# Patient Record
Sex: Male | Born: 2000 | Race: White | Hispanic: No | Marital: Single | Smoking: Never smoker
Health system: Southern US, Community
[De-identification: ages and names within clinical notes are randomized; demographics above are authoritative.]

## PROBLEM LIST (undated history)

## (undated) DIAGNOSIS — J4520 Mild intermittent asthma, uncomplicated: Secondary | ICD-10-CM

## (undated) HISTORY — PX: NO PAST SURGERIES: SHX2092

---
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
## 2023-01-20 ENCOUNTER — Encounter
Admission: RE | Admit: 2023-01-20 | Discharge: 2023-01-20 | Disposition: A | Discharge status: Home / Self Care | Payer: BC Managed Care – PPO | Source: Ambulatory Visit | Attending: Orthopedic Surgery | Admitting: Orthopedic Surgery

## 2023-01-20 ENCOUNTER — Other Ambulatory Visit: Payer: Self-pay | Admitting: Orthopedic Surgery

## 2023-01-20 HISTORY — DX: Mild intermittent asthma, uncomplicated: J45.20

## 2023-01-20 NOTE — Patient Instructions (Addendum)
Your procedure is scheduled on: Tuesday, April 9 Report to the Registration Desk on the 1st floor of the [facility]. To find out your arrival time, please call [phone] between 1PM - 3PM on: Monday, April 8 If your arrival time is 6:00 am, do not arrive before that time as the [place] entrance doors do not open until 6:00 am.  REMEMBER: Instructions that are not followed completely may result in serious medical risk, up to and including death; or upon the discretion of your surgeon and anesthesiologist your surgery may need to be rescheduled.  Do not eat food after midnight the night before surgery.  No gum chewing or hard candies.  You may however, drink CLEAR liquids up to 2 hours before you are scheduled to arrive for your surgery. Do not drink anything within 2 hours of your scheduled arrival time.  Clear liquids include: - water  - apple juice without pulp - gatorade (not RED colors) - black coffee or tea (Do NOT add milk or creamers to the coffee or tea) Do NOT drink anything that is not on this list.  One week prior to surgery: starting April 2 Stop meloxicam and Anti-inflammatories (NSAIDS) such as Advil, Aleve, Ibuprofen, Motrin, Naproxen, Naprosyn and Aspirin based products such as Excedrin, Goody's Powder, BC Powder. Stop ANY OVER THE COUNTER supplements until after surgery. You may however, continue to take Tylenol if needed for pain up until the day of surgery.  DO NOT TAKE ANY MEDICATIONS THE MORNING OF SURGERY   No Alcohol for 24 hours before or after surgery.  No Smoking including e-cigarettes for 24 hours before surgery.  No chewable tobacco products for at least 6 hours before surgery.  No nicotine patches on the day of surgery.  Do not use any "recreational" drugs for at least a week (preferably 2 weeks) before your surgery.  Please be advised that the combination of cocaine and anesthesia may have negative outcomes, up to and including death. If you  test positive for cocaine, your surgery will be cancelled.  On the morning of surgery brush your teeth with toothpaste and water, you may rinse your mouth with mouthwash if you wish. Do not swallow any toothpaste or mouthwash.  Use CHG Soap as directed on instruction sheet.  Do not wear jewelry, make-up, hairpins, clips or nail polish.  Do not wear lotions, powders, or perfumes.   Do not shave body hair from the neck down 48 hours before surgery.  Contact lenses, hearing aids and dentures may not be worn into surgery.  Do not bring valuables to the hospital. [facility] is not responsible for any missing/lost belongings or valuables.   Notify your doctor if there is any change in your medical condition (cold, fever, infection).  Wear comfortable clothing (specific to your surgery type) to the hospital.  After surgery, you can help prevent lung complications by doing breathing exercises.  Take deep breaths and cough every 1-2 hours. Your doctor may order a device called an Incentive Spirometer to help you take deep breaths.  If you are being discharged the day of surgery, you will not be allowed to drive home. You will need a responsible individual to drive you home and stay with you for 24 hours after surgery.   If you are taking public transportation, you will need to have a responsible individual with you.  Please call the [place] Dept[facility] at [phone] if you have any questions about these instructions.  Surgery  Visitation Policy:  Patients having surgery or a procedure may have two visitors.  Children under the age of 66 must have an adult with them who is not the patient.     Preparing for Surgery with CHLORHEXIDINE GLUCONATE (CHG) Soap  Chlorhexidine Gluconate (CHG) Soap  o An antiseptic cleaner that kills germs and bonds with the skin to continue killing germs even after washing  o Used for showering the night before surgery and morning of  surgery  Before surgery, you can play an important role by reducing the number of germs on your skin.  CHG (Chlorhexidine gluconate) soap is an antiseptic cleanser which kills germs and bonds with the skin to continue killing germs even after washing.  Please do not use if you have an allergy to CHG or antibacterial soaps. If your skin becomes reddened/irritated stop using the CHG.  1. Shower the NIGHT BEFORE SURGERY and the MORNING OF SURGERY with CHG soap.  2. If you choose to wash your hair, wash your hair first as usual with your normal shampoo.  3. After shampooing, rinse your hair and body thoroughly to remove the shampoo.  4. Use CHG as you would any other liquid soap. You can apply CHG directly to the skin and wash gently with a scrungie or a clean washcloth.  5. Apply the CHG soap to your body only from the neck down. Do not use on open wounds or open sores. Avoid contact with your eyes, ears, mouth, and genitals (private parts). Wash face and genitals (private parts) with your normal soap.  6. Wash thoroughly, paying special attention to the area where your surgery will be performed.  7. Thoroughly rinse your body with warm water.  8. Do not shower/wash with your normal soap after using and rinsing off the CHG soap.  9. Pat yourself dry with a clean towel.  10. Wear clean pajamas to bed the night before surgery.  12. Place clean sheets on your bed the night of your first shower and do not sleep with pets.  13. Shower again with the CHG soap on the day of surgery prior to arriving at the hospital.  14. Do not apply any deodorants/lotions/powders.  15. Please wear clean clothes to the hospital.

## 2023-01-30 MED ORDER — ACETAMINOPHEN 500 MG PO TABS
1000.0000 mg | ORAL_TABLET | ORAL | Status: AC
  Administered 2023-01-31: 1000 mg via ORAL

## 2023-01-30 MED ORDER — CHLORHEXIDINE GLUCONATE CLOTH 2 % EX PADS: 6.0000 | MEDICATED_PAD | Freq: Once | CUTANEOUS | Status: DC

## 2023-01-30 MED ORDER — CHLORHEXIDINE GLUCONATE CLOTH 2 % EX PADS
6.0000 | MEDICATED_PAD | Freq: Once | CUTANEOUS | Status: AC
  Administered 2023-01-31: 6 via TOPICAL

## 2023-01-30 MED ORDER — CHLORHEXIDINE GLUCONATE 0.12 % MT SOLN
15.0000 mL | Freq: Once | OROMUCOSAL | Status: AC
  Administered 2023-01-31: 15 mL via OROMUCOSAL

## 2023-01-30 MED ORDER — FAMOTIDINE 20 MG PO TABS
20.0000 mg | ORAL_TABLET | Freq: Once | ORAL | Status: AC
  Administered 2023-01-31: 20 mg via ORAL

## 2023-01-30 MED ORDER — ORAL CARE MOUTH RINSE: 15.0000 mL | Freq: Once | OROMUCOSAL | Status: AC

## 2023-01-30 MED ORDER — CEFAZOLIN SODIUM-DEXTROSE 2-4 GM/100ML-% IV SOLN
2.0000 g | INTRAVENOUS | Status: AC
  Administered 2023-01-31: 2 g via INTRAVENOUS

## 2023-01-30 MED ORDER — LACTATED RINGERS IV SOLN: INTRAVENOUS | Status: DC

## 2023-01-31 ENCOUNTER — Other Ambulatory Visit: Payer: Self-pay

## 2023-01-31 ENCOUNTER — Encounter: Payer: Self-pay | Admitting: Orthopedic Surgery

## 2023-01-31 ENCOUNTER — Ambulatory Visit: Payer: BC Managed Care – PPO

## 2023-01-31 ENCOUNTER — Ambulatory Visit
Admission: RE | Admit: 2023-01-31 | Discharge: 2023-01-31 | Disposition: A | Discharge status: Home / Self Care | Payer: BC Managed Care – PPO | Attending: Orthopedic Surgery | Admitting: Orthopedic Surgery

## 2023-01-31 ENCOUNTER — Encounter
Admission: RE | Disposition: A | Discharge status: Home / Self Care | Payer: Self-pay | Source: Home / Self Care | Attending: Orthopedic Surgery

## 2023-01-31 ENCOUNTER — Ambulatory Visit: Payer: BC Managed Care – PPO | Admitting: Anesthesiology

## 2023-01-31 ENCOUNTER — Ambulatory Visit: Payer: BC Managed Care – PPO | Admitting: Urgent Care

## 2023-01-31 DIAGNOSIS — X58XXXA Exposure to other specified factors, initial encounter: Secondary | ICD-10-CM | POA: Diagnosis not present

## 2023-01-31 DIAGNOSIS — S83512A Sprain of anterior cruciate ligament of left knee, initial encounter: Secondary | ICD-10-CM | POA: Insufficient documentation

## 2023-01-31 DIAGNOSIS — J4520 Mild intermittent asthma, uncomplicated: Secondary | ICD-10-CM | POA: Diagnosis not present

## 2023-01-31 HISTORY — PX: KNEE ARTHROSCOPY WITH ANTERIOR CRUCIATE LIGAMENT (ACL) REPAIR WITH HAMSTRING GRAFT: SHX5645

## 2023-01-31 LAB — GLUCOSE, CAPILLARY: Glucose-Capillary: 158 mg/dL — ABNORMAL HIGH (ref 70–99)

## 2023-01-31 SURGERY — KNEE ARTHROSCOPY WITH ANTERIOR CRUCIATE LIGAMENT (ACL) REPAIR WITH HAMSTRING GRAFT
Anesthesia: General | Laterality: Left

## 2023-01-31 MED ORDER — HYDROMORPHONE HCL 1 MG/ML IJ SOLN
INTRAMUSCULAR | Status: AC
  Filled 2023-01-31: qty 1

## 2023-01-31 MED ORDER — NEOMYCIN-POLYMYXIN B GU 40-200000 IR SOLN
Status: AC
  Filled 2023-01-31: qty 20

## 2023-01-31 MED ORDER — EPHEDRINE 5 MG/ML INJ
INTRAVENOUS | Status: AC
  Filled 2023-01-31: qty 5

## 2023-01-31 MED ORDER — ROCURONIUM BROMIDE 100 MG/10ML IV SOLN
INTRAVENOUS | Status: DC | PRN
  Administered 2023-01-31: 40 mg via INTRAVENOUS

## 2023-01-31 MED ORDER — ONDANSETRON HCL 4 MG PO TABS: 4.0000 mg | ORAL_TABLET | Freq: Three times a day (TID) | ORAL | 0 refills | Status: AC | PRN

## 2023-01-31 MED ORDER — BUPIVACAINE HCL (PF) 0.5 % IJ SOLN
INTRAMUSCULAR | Status: DC | PRN
  Administered 2023-01-31: 20 mL
  Administered 2023-01-31: 10 mL

## 2023-01-31 MED ORDER — FAMOTIDINE 20 MG PO TABS
ORAL_TABLET | ORAL | Status: AC
  Filled 2023-01-31: qty 1

## 2023-01-31 MED ORDER — CHLORHEXIDINE GLUCONATE 0.12 % MT SOLN
OROMUCOSAL | Status: AC
  Filled 2023-01-31: qty 15

## 2023-01-31 MED ORDER — SUGAMMADEX SODIUM 200 MG/2ML IV SOLN
INTRAVENOUS | Status: DC | PRN
  Administered 2023-01-31: 160 mg via INTRAVENOUS

## 2023-01-31 MED ORDER — OXYCODONE HCL 5 MG PO TABS: 5.0000 mg | ORAL_TABLET | ORAL | 0 refills | Status: AC | PRN

## 2023-01-31 MED ORDER — MIDAZOLAM HCL 2 MG/2ML IJ SOLN
INTRAMUSCULAR | Status: AC
  Filled 2023-01-31: qty 2

## 2023-01-31 MED ORDER — FENTANYL CITRATE (PF) 100 MCG/2ML IJ SOLN
INTRAMUSCULAR | Status: AC
  Filled 2023-01-31: qty 2

## 2023-01-31 MED ORDER — ROCURONIUM BROMIDE 10 MG/ML (PF) SYRINGE
PREFILLED_SYRINGE | INTRAVENOUS | Status: AC
  Filled 2023-01-31: qty 10

## 2023-01-31 MED ORDER — OXYCODONE HCL 5 MG PO TABS: 5.0000 mg | ORAL_TABLET | Freq: Once | ORAL | Status: DC | PRN

## 2023-01-31 MED ORDER — PROPOFOL 10 MG/ML IV BOLUS
INTRAVENOUS | Status: DC | PRN
  Administered 2023-01-31: 200 mg via INTRAVENOUS

## 2023-01-31 MED ORDER — LIDOCAINE HCL 1 % IJ SOLN
INTRAMUSCULAR | Status: DC | PRN
  Administered 2023-01-31: 5 mL

## 2023-01-31 MED ORDER — DROPERIDOL 2.5 MG/ML IJ SOLN
0.6250 mg | Freq: Once | INTRAMUSCULAR | Status: AC
  Administered 2023-01-31: 0.625 mg via INTRAVENOUS

## 2023-01-31 MED ORDER — FENTANYL CITRATE (PF) 100 MCG/2ML IJ SOLN
INTRAMUSCULAR | Status: DC | PRN
  Administered 2023-01-31: 100 ug via INTRAVENOUS

## 2023-01-31 MED ORDER — MIDAZOLAM HCL 2 MG/2ML IJ SOLN
INTRAMUSCULAR | Status: DC | PRN
  Administered 2023-01-31: 2 mg via INTRAVENOUS

## 2023-01-31 MED ORDER — 0.9 % SODIUM CHLORIDE (POUR BTL) OPTIME
TOPICAL | Status: DC | PRN
  Administered 2023-01-31: 250 mL

## 2023-01-31 MED ORDER — ONDANSETRON HCL 4 MG/2ML IJ SOLN
INTRAMUSCULAR | Status: DC | PRN
  Administered 2023-01-31: 4 mg via INTRAVENOUS

## 2023-01-31 MED ORDER — FENTANYL CITRATE (PF) 100 MCG/2ML IJ SOLN
25.0000 ug | INTRAMUSCULAR | Status: DC | PRN
  Administered 2023-01-31 (×4): 25 ug via INTRAVENOUS

## 2023-01-31 MED ORDER — DEXAMETHASONE SODIUM PHOSPHATE 10 MG/ML IJ SOLN
INTRAMUSCULAR | Status: DC | PRN
  Administered 2023-01-31: 10 mg via INTRAVENOUS

## 2023-01-31 MED ORDER — BUPIVACAINE HCL (PF) 0.5 % IJ SOLN
INTRAMUSCULAR | Status: AC
  Filled 2023-01-31: qty 10

## 2023-01-31 MED ORDER — OXYCODONE HCL 5 MG/5ML PO SOLN: 5.0000 mg | Freq: Once | ORAL | Status: DC | PRN

## 2023-01-31 MED ORDER — ONDANSETRON HCL 4 MG/2ML IJ SOLN
INTRAMUSCULAR | Status: AC
  Filled 2023-01-31: qty 2

## 2023-01-31 MED ORDER — BUPIVACAINE HCL (PF) 0.25 % IJ SOLN
INTRAMUSCULAR | Status: DC | PRN
  Administered 2023-01-31: 30 mL

## 2023-01-31 MED ORDER — DROPERIDOL 2.5 MG/ML IJ SOLN
INTRAMUSCULAR | Status: AC
  Filled 2023-01-31: qty 2

## 2023-01-31 MED ORDER — EPHEDRINE SULFATE (PRESSORS) 50 MG/ML IJ SOLN
INTRAMUSCULAR | Status: DC | PRN
  Administered 2023-01-31 (×2): 10 mg via INTRAVENOUS

## 2023-01-31 MED ORDER — BUPIVACAINE HCL (PF) 0.5 % IJ SOLN
INTRAMUSCULAR | Status: AC
  Filled 2023-01-31: qty 20

## 2023-01-31 MED ORDER — LACTATED RINGERS IR SOLN
Status: DC | PRN
  Administered 2023-01-31: 3000 mL
  Administered 2023-01-31: 12000 mL
  Administered 2023-01-31: 3000 mL

## 2023-01-31 MED ORDER — ACETAMINOPHEN 500 MG PO TABS
ORAL_TABLET | ORAL | Status: AC
  Filled 2023-01-31: qty 2

## 2023-01-31 MED ORDER — CEFAZOLIN SODIUM-DEXTROSE 2-4 GM/100ML-% IV SOLN
INTRAVENOUS | Status: AC
  Filled 2023-01-31: qty 100

## 2023-01-31 MED ORDER — DOCUSATE SODIUM 100 MG PO CAPS: 100.0000 mg | ORAL_CAPSULE | Freq: Two times a day (BID) | ORAL | 1 refills | Status: AC

## 2023-01-31 MED ORDER — DEXMEDETOMIDINE HCL IN NACL 80 MCG/20ML IV SOLN
INTRAVENOUS | Status: DC | PRN
  Administered 2023-01-31: 8 ug via BUCCAL

## 2023-01-31 MED ORDER — BUPIVACAINE HCL (PF) 0.25 % IJ SOLN
INTRAMUSCULAR | Status: AC
  Filled 2023-01-31: qty 30

## 2023-01-31 MED ORDER — BISACODYL 5 MG PO TBEC: 5.0000 mg | DELAYED_RELEASE_TABLET | Freq: Every day | ORAL | 1 refills | Status: AC | PRN

## 2023-01-31 MED ORDER — ONDANSETRON HCL 4 MG/2ML IJ SOLN
4.0000 mg | Freq: Once | INTRAMUSCULAR | Status: AC
  Administered 2023-01-31: 4 mg via INTRAVENOUS

## 2023-01-31 MED ORDER — PROPOFOL 1000 MG/100ML IV EMUL
INTRAVENOUS | Status: AC
  Filled 2023-01-31: qty 100

## 2023-01-31 MED ORDER — HYDROMORPHONE HCL 1 MG/ML IJ SOLN
INTRAMUSCULAR | Status: DC | PRN
  Administered 2023-01-31: .5 mg via INTRAVENOUS
  Administered 2023-01-31 (×2): .25 mg via INTRAVENOUS

## 2023-01-31 MED ORDER — LIDOCAINE HCL (CARDIAC) PF 100 MG/5ML IV SOSY
PREFILLED_SYRINGE | INTRAVENOUS | Status: DC | PRN
  Administered 2023-01-31: 60 mg via INTRAVENOUS

## 2023-01-31 MED ORDER — LIDOCAINE HCL (PF) 1 % IJ SOLN
INTRAMUSCULAR | Status: AC
  Filled 2023-01-31: qty 30

## 2023-01-31 SURGICAL SUPPLY — 100 items
ADAPTER IRRIG TUBE 2 SPIKE SOL (ADAPTER) ×2 IMPLANT
ADPR TBG 2 SPK PMP STRL ASCP (ADAPTER) ×2
ANCH SUT SWLK 19.1X4.75 VT (Anchor) ×1 IMPLANT
ANCHOR BUTTON TIGHTROPE ACL RT (Orthopedic Implant) IMPLANT
ANCHOR PEEK 4.75X19.1 SWLK C (Anchor) IMPLANT
BASIN GRAD PLASTIC 32OZ STRL (MISCELLANEOUS) ×1 IMPLANT
BIT DRILL PIN RETRO (DRILL) IMPLANT
BLADE FULL RADIUS 3.5 (BLADE) ×1 IMPLANT
BLADE SHAVER 4.5X7 STR FR (MISCELLANEOUS) ×1 IMPLANT
BLADE SURG 15 STRL LF DISP TIS (BLADE) ×1 IMPLANT
BLADE SURG 15 STRL SS (BLADE) ×1
BLADE SURG SZ11 CARB STEEL (BLADE) ×1 IMPLANT
BNDG CMPR 5X4 CHSV STRCH STRL (GAUZE/BANDAGES/DRESSINGS) ×1
BNDG CMPR 5X6 CHSV STRCH STRL (GAUZE/BANDAGES/DRESSINGS) ×1
BNDG COHESIVE 4X5 TAN STRL LF (GAUZE/BANDAGES/DRESSINGS) ×1 IMPLANT
BNDG COHESIVE 6X5 TAN ST LF (GAUZE/BANDAGES/DRESSINGS) ×1 IMPLANT
CLEANER CAUTERY TIP 5X5 PAD (MISCELLANEOUS) ×1 IMPLANT
COOLER POLAR GLACIER W/PUMP (MISCELLANEOUS) ×1 IMPLANT
COVER BACK TABLE REUSABLE LG (DRAPES) ×1 IMPLANT
CUTTER DUAL RETRO 9.5 STRL (CUTTER) IMPLANT
DRAPE ARTHRO LIMB 89X125 STRL (DRAPES) ×1 IMPLANT
DRAPE FLUOR MINI C-ARM 54X84 (DRAPES) ×1 IMPLANT
DRAPE IMP U-DRAPE 54X76 (DRAPES) ×2 IMPLANT
DRAPE INCISE IOBAN 66X45 STRL (DRAPES) IMPLANT
DRAPE POUCH INSTRU U-SHP 10X18 (DRAPES) ×1 IMPLANT
DRAPE U-SHAPE 47X51 STRL (DRAPES) ×1 IMPLANT
DRILL FLIPCUTTER III 6-12 (ORTHOPEDIC DISPOSABLE SUPPLIES) IMPLANT
DRILL PIN RETRO (DRILL) ×1
DURAPREP 26ML APPLICATOR (WOUND CARE) ×3 IMPLANT
ELECT REM PT RETURN 9FT ADLT (ELECTROSURGICAL) ×1
ELECTRODE REM PT RTRN 9FT ADLT (ELECTROSURGICAL) ×1 IMPLANT
FLIPCUTTER III 6-12 AR-1204FF (ORTHOPEDIC DISPOSABLE SUPPLIES) ×1
GAUZE SPONGE 4X4 12PLY STRL (GAUZE/BANDAGES/DRESSINGS) ×1 IMPLANT
GAUZE XEROFORM 1X8 LF (GAUZE/BANDAGES/DRESSINGS) ×1 IMPLANT
GLOVE BIOGEL PI IND STRL 7.5 (GLOVE) ×1 IMPLANT
GLOVE BIOGEL PI IND STRL 9 (GLOVE) ×1 IMPLANT
GLOVE BIOGEL PI ORTHO SZ9 (GLOVE) ×4 IMPLANT
GLOVE SURG SYN 7.5  E (GLOVE) ×1
GLOVE SURG SYN 7.5 E (GLOVE) ×1 IMPLANT
GLOVE SURG SYN 7.5 PF PI (GLOVE) ×1 IMPLANT
GOWN STRL REUS TWL 2XL XL LVL4 (GOWN DISPOSABLE) ×1 IMPLANT
GOWN STRL REUS W/ TWL LRG LVL3 (GOWN DISPOSABLE) ×1 IMPLANT
GOWN STRL REUS W/ TWL XL LVL3 (GOWN DISPOSABLE) ×1 IMPLANT
GOWN STRL REUS W/TWL LRG LVL3 (GOWN DISPOSABLE) ×1
GOWN STRL REUS W/TWL XL LVL3 (GOWN DISPOSABLE) ×1
GRADUATE 1200CC STRL 31836 (MISCELLANEOUS) ×1 IMPLANT
GUIDEWIRE 1.2MMX18 (WIRE) ×1 IMPLANT
HANDLE YANKAUER SUCT BULB TIP (MISCELLANEOUS) ×1 IMPLANT
IMP SYS 2ND FIX PEEK 4.75X19.1 (Miscellaneous) ×1 IMPLANT
IMPL SYS 2ND FX PEEK 4.75X19.1 (Miscellaneous) IMPLANT
IV LACTATED RINGER IRRG 3000ML (IV SOLUTION) ×6
IV LR IRRIG 3000ML ARTHROMATIC (IV SOLUTION) ×6 IMPLANT
KIT TRANSTIBIAL (DISPOSABLE) IMPLANT
KIT TURNOVER KIT A (KITS) ×1 IMPLANT
LABEL OR SOLS (LABEL) ×1 IMPLANT
MANIFOLD NEPTUNE II (INSTRUMENTS) ×2 IMPLANT
MAT ABSORB  FLUID 56X50 GRAY (MISCELLANEOUS) ×2
MAT ABSORB FLUID 56X50 GRAY (MISCELLANEOUS) ×2 IMPLANT
NDL FILTER BLUNT 18X1 1/2 (NEEDLE) ×1 IMPLANT
NDL HYPO 22X1.5 SAFETY MO (MISCELLANEOUS) ×1 IMPLANT
NDL MAYO 6 CRC TAPER PT (NEEDLE) IMPLANT
NDL SAFETY ECLIP 18X1.5 (MISCELLANEOUS) ×1 IMPLANT
NEEDLE FILTER BLUNT 18X1 1/2 (NEEDLE) ×1 IMPLANT
NEEDLE HYPO 22X1.5 SAFETY MO (MISCELLANEOUS) ×1 IMPLANT
NEEDLE MAYO 6 CRC TAPER PT (NEEDLE) IMPLANT
PACK ARTHROSCOPY KNEE (MISCELLANEOUS) ×1 IMPLANT
PAD ABD DERMACEA PRESS 5X9 (GAUZE/BANDAGES/DRESSINGS) ×2 IMPLANT
PAD WRAPON POLAR KNEE (MISCELLANEOUS) ×1 IMPLANT
PENCIL SMOKE EVACUATOR (MISCELLANEOUS) IMPLANT
SCREW FT BIOCOMP 10X30 (Screw) IMPLANT
SHAVER BLADE BONE CUTTER  5.5 (BLADE) ×1
SHAVER BLADE BONE CUTTER 4.5 (BLADE) IMPLANT
SHAVER BLADE BONE CUTTER 5.5 (BLADE) ×1 IMPLANT
SPONGE T-LAP 18X18 ~~LOC~~+RFID (SPONGE) ×1 IMPLANT
STRIP CLOSURE SKIN 1/2X4 (GAUZE/BANDAGES/DRESSINGS) ×2 IMPLANT
SUCTION FRAZIER HANDLE 10FR (MISCELLANEOUS) ×1
SUCTION TUBE FRAZIER 10FR DISP (MISCELLANEOUS) ×1 IMPLANT
SUT 2 FIBERLOOP 20 STRT BLUE (SUTURE) ×4
SUT ETHILON 4-0 (SUTURE) ×1
SUT ETHILON 4-0 FS2 18XMFL BLK (SUTURE) ×1
SUT FIBERSNARE 2 CLSD LOOP (SUTURE) IMPLANT
SUT FIBERWIRE #2 38 T-5 BLUE (SUTURE) ×4
SUT MNCRL AB 4-0 PS2 18 (SUTURE) ×1 IMPLANT
SUT VIC AB 0 CT1 36 (SUTURE) ×1 IMPLANT
SUT VIC AB 2-0 CT2 27 (SUTURE) IMPLANT
SUT VIC AB 2-0 SH 27 (SUTURE) ×1
SUT VIC AB 2-0 SH 27XBRD (SUTURE) ×1 IMPLANT
SUTURE 2 FIBERLOOP 20 STRT BLU (SUTURE) ×2 IMPLANT
SUTURE ETHLN 4-0 FS2 18XMF BLK (SUTURE) ×1 IMPLANT
SUTURE FIBERWR #2 38 T-5 BLUE (SUTURE) ×2 IMPLANT
SYR 10ML LL (SYRINGE) ×2 IMPLANT
SYR BULB IRRIG 60ML STRL (SYRINGE) ×1 IMPLANT
TAPE UMBILICAL 1/8 X36 TWILL (MISCELLANEOUS) ×1 IMPLANT
TOWEL OR 17X26 4PK STRL BLUE (TOWEL DISPOSABLE) ×2 IMPLANT
TRAP FLUID SMOKE EVACUATOR (MISCELLANEOUS) ×1 IMPLANT
TUBE SET DOUBLEFLO INFLOW (TUBING) ×1 IMPLANT
TUBING CONNECTING 10 (TUBING) IMPLANT
TUBING OUTFLOW SET DBLFO PUMP (TUBING) ×1 IMPLANT
WAND WEREWOLF FLOW 90D (MISCELLANEOUS) ×1 IMPLANT
WRAPON POLAR PAD KNEE (MISCELLANEOUS) ×1

## 2023-01-31 NOTE — Op Note (Signed)
01/31/2023  12:55 PM  PATIENT:  Raymond Gonzales    PRE-OPERATIVE DIAGNOSIS:  Left ACL Tear  POST-OPERATIVE DIAGNOSIS:  Same  PROCEDURE:  LEFT KNEE ARTHROSCOPY WITH ANTERIOR CRUCIATE LIGAMENT (ACL) RECONSTRUCTION WITH HAMSTRING AUTOGRAFT  SURGEON:  Juanell Fairly, MD  ANESTHESIA:   General  PREOPERATIVE INDICATIONS:  Raymond Gonzales is a  22 y.o. male with a diagnosis of Left ACL Tear, confirmed by MRI.  Given the patient's young age and involvement with athletics, the decision was made to proceed with a left knee ACL reconstruction with hamstring autograft.    The risks benefits and alternatives were discussed with the patient preoperatively including but not limited to the risks of infection, bleeding, nerve or blood vessel injury, knee stiffness/arthrofibrosis, hardware failure, re-tear of the anterior cruciate ligament graft, persistent pain or instability, osteoarthritis and the need for revision surgery.  Patient understood these risks and wished to proceed with surgical reconstruction.   OPERATIVE IMPLANTS: Arthrex anterior cruciate ligament tightrope RC for femoral fixation, Arthrex biocomposite 10 x 30 mm tibial interference screw and Arthrex Swiveloc anchors x 2 for backup tibial fixation.  OPERATIVE FINDINGS: Patient had an ACL tear from the femoral insertion.  There was some scar tissue still adhering some proximal fibers but the patient appeared to have a complete tear.  There is laxity of the ACL.  Patient had no focal chondral or osteochondral lesions.  There is no medial or lateral meniscal tears.    OPERATIVE PROCEDURE: Patient was seen in the preoperative area with his family at the bedside. The left knee was marked with the word yes according the hospital's correct site of surgery protocol. The patient was brought to the operating room and placed in the supine position. General anesthesia was administered.  Two grams of Ancef IV were given for antibiotic prophylaxis. The lower  extremity was prepped and draped in usual sterile fashion.   A time out was performed to verify the patient's name, date of birth, medical record number, correct site of surgery correct procedure to be performed. It was also used to verify the patient received antibiotics and all appropriate instruments, implants and radiographs studies were available in the room. Once all in attendance were in agreement case began. A tourniquet was applied to the left upper thigh but was not inflated.  Exam under anesthesia was performed which demonstrated increased laxity of approximately 5 to 10 mm on both Lachman's and anterior drawer testing.  He had an equivocal pivot shift.   Patient had no instability to varus valgus stress testing at 0 and 30 of flexion. His knee range of motion was from 0 120. He did not have a significant knee effusion.   Proposed arthroscopy incisions were drawn out with a surgical marker and pre-injected with 1% lidocaine plain. An 11 blade was used to establish an inferior medial and lateral portals. The medial portal was created under direct visualization using an 18-gauge spinal needle for localization. A full diagnostic examination of the knee was performed including the suprapatellar pouch, the patella femoral joint, medial lateral gutters, the medial and lateral compartments, the intercondylar notch in the posterior knee.  Patient had no evidence of medial or lateral meniscal tears with probing.  There were no focal chondral lesions.  There were no chondral loose bodies.  Patient had extensive scarring and synovitis along with hypertrophy of Hoffa's fat pad in the anterior knee.  This was debrided with a tapered shaver blade and 90 degree werewolf wand.  The anterior  cruciate ligament fibers were probed.  An ACL tear was confirmed.  The torn ACL fibers were debrided with a [employer] 90 degree werewolf wand. A 4.5 mm bone cutter resector shaver blade was used perform a notchplasty.  Once the intercondylar notch was prepped the attention was turned to harvesting the hamstring autografts.  A longitudinal incision was made over the anteromedial proximal tibia. The sartorius fascia was incised with a 15 blade and reflected to reveal the underlying gracilis and semitendinosus. These were harvested using a tendon stripper.  After successful harvest, the tendons were prepared on the back table. The combined 4 stranded hamstring autograft was measured to be 9.5 mm on the femoral side and 9.5 mm on the tibial side. The length of the graft was 230 mm. The graft was placed on the Graftmaster table under 15 mmHg of tension and kept moist on the back table until implantation.   The attention was then turned to tunnel creation. The femoral tunnel cutting guide was then placed through the lateral portal. The arthroscope was placed in the medial portal at this point. The intercondylar distance was measured at 35 mm. A flip cutter drill guide was advanced into the intercondylar notch. The blade was engaged and the femoral tunnel was created in a retrograde fashion to 30 mm. A fiber stick suture was placed through the femoral tunnel brought out the lateral portal and clamped for later graft passage.   The attention was then turned to tibial tunnel creation. This was done with a fixed angle tibial retro-drill guide. A drill pin was inserted through the anterior tibia and advanced until it engaged the 9.5 mm drill bit. A tibial tunnel was then created in a retrograde fashion. The fiber stick suture loop was brought out through the tibial tunnel. The four stranded hamstring tibial autograft was then shuttled through the knee using the fiber stick suture. Once the [facility] button was flipped on the lateral femoral cortex, counter tension was applied on the graft.  A FluoroScan image was taken to confirm the button was laying flat against the lateral cortex of the femur. Once this was confirmed the  hamstring graft was advanced into position using the white suture ends of the Arthrex tight rope RC button. The graft was bottomed out into the femoral tunnel at 30 mm. The knee was then cycled 25 times to remove creep. The knee was then flexed approximately 30. An Arthrex bio composite interference screw 10 x 30 mm was then advanced into position with countertraction on the tibial side of the graft and a posterior drawer force directed to the tibia. Once the interference screw was in position, two Arthrex Swiveloc anchors were loaded with sutures from the tibial end of the handstring grafts as backup fixation.  After the implants were all in place, the patient had a firm endpoint without anterior laxity on Lachman's test.  Final arthroscopic images of the graft were taken. There was no graft impingement in full extension. The wounds were copiously irrigated. The deep fascia of the anterior tibial incision was closed with interrupted 0 Vicryl.  The subcutaneous tissue was closed with a 2-0 Vicryl and the skin was approximated with a running 4-0 Monocryl. The arthroscopy portal incisions were closed with 4-0 nylon along with the small stab incision over the lateral femur used for placement of the femoral tunnel.  Patient had a dry sterile dressing applied along with Steri-Strips and Xeroform. The incisions and the joint were injected with  0.25% Marcaine plain.  Patient had a Polar Care sleeve placed along with a hinged knee brace locked in extension. The patient was brought to the PACU in stable condition. I was scrubbed and present the entire case and all sharp and instrument counts were correct at conclusion the case. ..  The patient was awakened and brought to PACU in stable condition. I spoke with the patient's parents in the postop consultation room to let them know that patient was stable in recovery room the case had been performed without complication.  I reviewed the post operative instructions with  them in detail and answered all their questions.

## 2023-01-31 NOTE — Anesthesia Procedure Notes (Signed)
Anesthesia Regional Block: Popliteal block   Pre-Anesthetic Checklist: , timeout performed,  Correct Patient, Correct Site, Correct Laterality,  Correct Procedure,, risks and benefits discussed,  Surgical consent,  Pre-op evaluation,  At surgeon's request and post-op pain management  Laterality: Left  Prep: chloraprep       Needles:  Injection technique: Single-shot  Needle Type: Echogenic Needle          Additional Needles:   Procedures:,,,, ultrasound used (permanent image in chart),,   Motor weakness within 20 minutes.  Narrative:  Start time: 01/31/2023 1:06 PM End time: 01/31/2023 1:07 PM Injection made incrementally with aspirations every 5 mL.  Performed by: Personally  Anesthesiologist: Reed Breech, MD  Additional Notes: Functioning IV was confirmed and monitors applied.  Sterile prep and drape, hand hygiene and sterile gloves were used. Ultrasound guidance: relevant anatomy identified, needle position confirmed, local anesthetic spread visualized around nerve(s), vascular puncture avoided.  Image saved to electronic medical record.  Negative aspiration prior to incremental administration of local anesthetic for total 20 ml bupivacaine 0.5% given in popliteal sciatic distribution. The patient tolerated the procedure well. Vital signs and moderate sedation medications recorded in RN notes. Done postoperatively in PACU.

## 2023-01-31 NOTE — Anesthesia Postprocedure Evaluation (Signed)
Anesthesia Post Note  Patient: Raymond Gonzales  Procedure(s) Performed: KNEE ARTHROSCOPY WITH ANTERIOR CRUCIATE LIGAMENT (ACL) REPAIR WITH HAMSTRING GRAFT (Left)  Patient location during evaluation: PACU Anesthesia Type: General Level of consciousness: awake and alert, oriented and patient cooperative Pain management: pain level controlled Vital Signs Assessment: post-procedure vital signs reviewed and stable Respiratory status: spontaneous breathing, nonlabored ventilation and respiratory function stable Cardiovascular status: blood pressure returned to baseline and stable Postop Assessment: adequate PO intake Anesthetic complications: no Comments: Postoperative adductor and popliteal nerve blocks done in PACU with good result.   No notable events documented.   Last Vitals:  Vitals:   01/31/23 1305 01/31/23 1310  BP: 119/64 132/86  Pulse: (!) 101 94  Resp: 12 10  Temp:  36.8 C  SpO2: 100% 100%    Last Pain:  Vitals:   01/31/23 1315  TempSrc:   PainSc: 4                  Reed Breech

## 2023-01-31 NOTE — Anesthesia Preprocedure Evaluation (Signed)
Anesthesia Evaluation  Patient identified by MRN, date of birth, ID band Patient awake    Reviewed: Allergy & Precautions, NPO status , Patient's Chart, lab work & pertinent test results  History of Anesthesia Complications Negative for: history of anesthetic complications  Airway Mallampati: II  TM Distance: >3 FB Neck ROM: full    Dental  (+) Chipped   Pulmonary neg shortness of breath, asthma    Pulmonary exam normal        Cardiovascular Exercise Tolerance: Good (-) angina (-) Past MI negative cardio ROS Normal cardiovascular exam     Neuro/Psych negative neurological ROS  negative psych ROS   GI/Hepatic negative GI ROS, Neg liver ROS,neg GERD  ,,  Endo/Other  negative endocrine ROS    Renal/GU      Musculoskeletal   Abdominal   Peds  Hematology negative hematology ROS (+)   Anesthesia Other Findings Past Medical History: No date: Mild intermittent asthma  Past Surgical History: No date: NO PAST SURGERIES     Reproductive/Obstetrics negative OB ROS                             Anesthesia Physical Anesthesia Plan  ASA: 2  Anesthesia Plan: General ETT   Post-op Pain Management: Regional block*   Induction: Intravenous  PONV Risk Score and Plan: Ondansetron, Dexamethasone, Midazolam and Treatment may vary due to age or medical condition  Airway Management Planned: Oral ETT  Additional Equipment:   Intra-op Plan:   Post-operative Plan: Extubation in OR  Informed Consent: I have reviewed the patients History and Physical, chart, labs and discussed the procedure including the risks, benefits and alternatives for the proposed anesthesia with the patient or authorized representative who has indicated his/her understanding and acceptance.     Dental Advisory Given  Plan Discussed with: Anesthesiologist, CRNA and Surgeon  Anesthesia Plan Comments: (Consented for post  Op PNB PRN  Patient consented for risks of anesthesia including but not limited to:  - adverse reactions to medications - damage to eyes, teeth, lips or other oral mucosa - nerve damage due to positioning  - sore throat or hoarseness - Damage to heart, brain, nerves, lungs, other parts of body or loss of life  Patient voiced understanding.)       Anesthesia Quick Evaluation

## 2023-01-31 NOTE — Transfer of Care (Signed)
Immediate Anesthesia Transfer of Care Note  Patient: Raymond Gonzales  Procedure(s) Performed: KNEE ARTHROSCOPY WITH ANTERIOR CRUCIATE LIGAMENT (ACL) REPAIR WITH HAMSTRING GRAFT (Left)  Patient Location: PACU  Anesthesia Type:General  Level of Consciousness: sedated  Airway & Oxygen Therapy: Patient Spontanous Breathing and Patient connected to nasal cannula oxygen  Post-op Assessment: Report given to RN and Post -op Vital signs reviewed and stable  Post vital signs: Reviewed and stable  Last Vitals:  Vitals Value Taken Time  BP 132/72 01/31/23 1212  Temp 36.9 C 01/31/23 1212  Pulse 107 01/31/23 1214  Resp 14 01/31/23 1214  SpO2 98 % 01/31/23 1214  Vitals shown include unvalidated device data.  Last Pain:  Vitals:   01/31/23 0654  TempSrc: Temporal  PainSc: 0-No pain         Complications: No notable events documented.

## 2023-01-31 NOTE — H&P (Signed)
PREOPERATIVE H&P  Chief Complaint: Left ACL Tear  HPI: Raymond Gonzales is a 22 y.o. male who presents for preoperative history and physical with a diagnosis of Left ACL Tear.  Given the patient's young age and involvement in athletics the decision was made to proceed with an ACL reconstruction with hamstring autograft.  Past Medical History:  Diagnosis Date   Mild intermittent asthma    Past Surgical History:  Procedure Laterality Date   NO PAST SURGERIES     Social History   Socioeconomic History   Marital status: Single    Spouse name: Not on file   Number of children: 0   Years of education: Not on file   Highest education level: Not on file  Occupational History   Not on file  Tobacco Use   Smoking status: Never   Smokeless tobacco: Never  Vaping Use   Vaping Use: Never used  Substance and Sexual Activity   Alcohol use: No   Drug use: No   Sexual activity: Not on file  Other Topics Concern   Not on file  Social History Narrative   Lives with parents   Social Determinants of Health   Financial Resource Strain: Not on file  Food Insecurity: Not on file  Transportation Needs: Not on file  Physical Activity: Not on file  Stress: Not on file  Social Connections: Not on file   History reviewed. No pertinent family history. No Known Allergies Prior to Admission medications   Medication Sig Start Date End Date Taking? Authorizing Provider  cetirizine (ZYRTEC) 10 MG chewable tablet Chew 10 mg by mouth daily.   Yes [provider]  meloxicam (MOBIC) 7.5 MG tablet Take 7.5 mg by mouth 2 (two) times daily.    [provider]     Positive ROS: All other systems have been reviewed and were otherwise negative with the exception of those mentioned in the HPI and as above.  Physical Exam: General: Alert, no acute distress Cardiovascular: Regular rate and rhythm, no murmurs rubs or gallops.  No pedal edema Respiratory: Clear to auscultation  bilaterally, no wheezes rales or rhonchi. No cyanosis, no use of accessory musculature GI: No organomegaly, abdomen is soft and non-tender nondistended with positive bowel sounds. Skin: Skin intact, no lesions within the operative field. Neurologic: Sensation intact distally Psychiatric: Patient is competent for consent with normal mood and affect Lymphatic: No cervical lymphadenopathy  MUSCULOSKELETAL: Left knee: Patient skin is intact.  There is no erythema ecchymosis or effusion.  His range of motion is from 0 to 120 degrees.  He has anterior laxity on Lachman's and anterior drawer testing but no instability to varus valgus stress testing at 0 and 30 degrees of flexion.  He no calf tenderness or lower leg edema and is distally neurovascular intact with 5 out of 5 strength.  Assessment: Left ACL Tear  Plan: Plan for Procedure(s): Left knee arthroscopy with anterior cruciate ligament reconstruction with hamstring autograft  I met the patient in the preoperative area with his family at the bedside.  I marked the left knee according to hospital's correct site of surgery protocol.  Preop history and physical was performed at the bedside.  I reviewed the details of the operation as well as the postoperative course and answered all their questions today.  I have previously discussed the risks and benefits of surgery in the office with the patient and his mother. They understand the risks include but are not limited to infection, bleeding, nerve  or blood vessel injury, joint stiffness or loss of motion, persistent pain, weakness or instability, the need for use of an allograft and retear of the anterior cruciate ligament, hardware failure and the need for further surgery.  Patient is to these risks and agreed with the plan for surgical reconstruction.     Juanell Fairly, MD   01/31/2023 7:49 AM

## 2023-01-31 NOTE — Anesthesia Procedure Notes (Signed)
Procedure Name: Intubation Date/Time: 01/31/2023 7:59 AM  Performed by: Ginger Carne, CRNAPre-anesthesia Checklist: Patient identified, Emergency Drugs available, Suction available, Patient being monitored and Timeout performed Patient Re-evaluated:Patient Re-evaluated prior to induction Oxygen Delivery Method: Circle system utilized Preoxygenation: Pre-oxygenation with 100% oxygen Induction Type: IV induction Ventilation: Mask ventilation without difficulty Laryngoscope Size: McGraph and 3 Grade View: Grade I Tube type: Oral Tube size: 6.5 mm Number of attempts: 1 Airway Equipment and Method: Stylet and Video-laryngoscopy Placement Confirmation: ETT inserted through vocal cords under direct vision, positive ETCO2 and breath sounds checked- equal and bilateral Secured at: 21 cm Tube secured with: Tape Dental Injury: Teeth and Oropharynx as per pre-operative assessment

## 2023-01-31 NOTE — Discharge Instructions (Addendum)
AMBULATORY SURGERY  DISCHARGE INSTRUCTIONS   The drugs that you were given will stay in your system until tomorrow so for the next 24 hours you should not:  Drive an automobile Make any legal decisions Drink any alcoholic beverage   You may resume regular meals tomorrow.  Today it is better to start with liquids and gradually work up to solid foods.  You may eat anything you prefer, but it is better to start with liquids, then soup and crackers, and gradually work up to solid foods.   Please notify your doctor immediately if you have any unusual bleeding, trouble breathing, redness and pain at the surgery site, drainage, fever, or pain not relieved by medication.    Additional Instructions:  POLAR CARE INFORMATION  [email or web address]  How to use [facility] Polar Care [facility] Cold Therapy System?  YouTube   ShippingScam.co.ukhttps://www.youtube.com/watch?v=[email or web address]  OPERATING INSTRUCTIONS  Start the product With dry hands, connect the transformer to the electrical connection located on the top of the cooler. Next, plug the transformer into an appropriate electrical outlet. The unit will automatically start running at this point.  To stop the pump, disconnect electrical power.  Unplug to stop the product when not in use. Unplugging the Polar Care unit turns it off. Always unplug immediately after use. Never leave it plugged in while unattended. Remove pad.    FIRST ADD WATER TO FILL LINE, THEN ICE---Replace ice when existing ice is almost melted  1 Discuss Treatment with your Licensed Health Care Practitioner and Use Only as Prescribed 2 Apply Insulation Barrier & Cold Therapy Pad 3 Check for Moisture 4 Inspect Skin Regularly  Tips and Trouble Shooting Usage Tips 1. Use cubed or chunked ice for optimal performance. 2. It is recommended to drain the Pad between uses. To drain the pad, hold the Pad upright with the hose pointed toward the ground. Depress the black plunger and allow water to drain  out. 3. You may disconnect the Pad from the unit without removing the pad from the affected area by depressing the silver tabs on the hose coupling and gently pulling the hoses apart. The Pad and unit will seal itself and will not leak. Note: Some dripping during release is normal. 4. DO NOT RUN PUMP WITHOUT WATER! The pump in this unit is designed to run with water. Running the unit without water will cause permanent damage to the pump. 5. Unplug unit before removing lid.  TROUBLESHOOTING GUIDE Pump not running, Water not flowing to the pad, Pad is not getting cold 1. Make sure the transformer is plugged into the wall outlet. 2. Confirm that the ice and water are filled to the indicated levels. 3. Make sure there are no kinks in the pad. 4. Gently pull on the blue tube to make sure the tube/pad junction is straight. 5. Remove the pad from the treatment site and ll it while the pad is lying at; then reapply. 6. Confirm that the pad couplings are securely attached to the unit. Listen for the double clicks (Figure 1) to confirm the pad couplings are securely attached.  Leaks    Note: Some condensation on the lines, controller, and pads is unavoidable, especially in warmer climates. 1. If using a [employer] Polar Care Cold Therapy unit with a detachable Cold Therapy Pad, and a leak exists (other than condensation on the lines) disconnect the pad couplings. Make sure the silver tabs on the couplings are depressed before reconnecting the pad to the pump hose; then confirm both  sides of the coupling are properly clicked in. 2. If the coupling continues to leak or a leak is detected in the pad itself, stop using it and call [employer] Customer Care at [phone].  Cleaning After use, empty and dry the unit with a soft cloth. Warm water and mild detergent may be used occasionally to clean the pump and tubes.  WARNING: The Polar Care Cube can be cold enough to cause serious injury, including full skin necrosis.  Follow these Operating Instructions, and carefully read the Product Insert (see pouch on side of unit) and the Cold Therapy Pad Fitting Instructions (provided with each Cold Therapy Pad) prior to use.         Femoral Nerve Block Discharge Instructions   1.  For your surgery you have received a femoral Nerve Block.  2.  Your Nerve Block is expected to last for about 4 to 12 hours. This is an estimated        time frame; the results of your nerve block may wear off sooner or may last longer.  3.  If needed, your surgeon will give you a prescription for pain medication. It will take     about 60 minutes for the oral pain medication to become fully effective. So, it is     recommended that you start taking this medication before the nerve block first begins     to wear off, or when you first begin to feel discomfort.  4.  Keep in mind that nerve blocks often wear off in the middle of the night. If you are      going to bed and the block has not started to wear off or you have not started to have      any discomfort, consider setting an alarm for 2 to 3 hours, so you can assess your      block. If you notice the block is wearing off or you are starting to have discomfort,           you can take your pain medication.  5. Take your pain medication only as prescribed. Pain medication can cause sedation          and decrease your breathing if you take more than you need for the level of pain that        you have.  6.  Nausea is a common side effect of many pain medications. You may want to eat             something before taking your pain medicine to prevent nausea.  7.  After a Femoral nerve block, you cannot feel pain, pressure or extremes in                   temperature in the effected leg. Because your leg is numb it is at an increased risk for     injury. To decrease the possibility of injury, please practice the following:   a. While you are awake change the position of your leg frequently to  prevent too      much pressure on any one area for prolonged periods of time.   b. If you have a cast or tight dressing, check the color or your toes every couple                of hours. Call your surgeon with the appearance of any discoloration (white or  blue).   c. You may have difficulty bearing weight on the effected leg. Have someone                assist you with walking until the nerve block has completely worn off.   d. If you surgeon prescribed a brace to be worn after surgery, DO NOT GET UP                AT NIGHT WITHOUT YOUR BRACE.              e. If your surgeon has restricted the amount of weight you should bear on the                      effected leg, i.e. No Weight, Partial Weight, or Touch Down Only, DO NOT                       BEAR MORE WEIGHT THAN INSTRUCTED.             f. If you experience any problems or concerns, please contact your surgeon's                     office.

## 2023-01-31 NOTE — Anesthesia Procedure Notes (Addendum)
Anesthesia Regional Block: Adductor canal block   Pre-Anesthetic Checklist: , timeout performed,  Correct Patient, Correct Site, Correct Laterality,  Correct Procedure,, risks and benefits discussed,  Surgical consent,  Pre-op evaluation,  At surgeon's request and post-op pain management  Laterality: Left  Prep: chloraprep       Needles:  Injection technique: Single-shot  Needle Type: Echogenic Needle          Additional Needles:   Procedures:,,,, ultrasound used (permanent image in chart),,   Motor weakness within 20 minutes.  Narrative:  Start time: 01/31/2023 1:09 PM End time: 01/31/2023 1:10 PM Injection made incrementally with aspirations every 5 mL.  Performed by: Personally  Anesthesiologist: Reed Breech, MD  Additional Notes: Functioning IV was confirmed and monitors applied.  Sterile prep and drape, hand hygiene and sterile gloves were used. Ultrasound guidance: relevant anatomy identified, needle position confirmed, local anesthetic spread visualized around nerve(s), vascular puncture avoided.  Image saved to electronic medical record.  Negative aspiration prior to incremental administration of local anesthetic for total 10 ml bupivacaine 0.5% given in adductor saphenous distribution. The patient tolerated the procedure well. Vital signs and moderate sedation medications recorded in RN notes. Postoperative nerve block done in PACU.

## 2023-02-01 ENCOUNTER — Encounter: Payer: Self-pay | Admitting: Orthopedic Surgery

## 2023-03-03 ENCOUNTER — Other Ambulatory Visit: Payer: Self-pay | Admitting: Orthopedic Surgery

## 2023-03-03 ENCOUNTER — Ambulatory Visit
Admission: RE | Admit: 2023-03-03 | Discharge: 2023-03-03 | Disposition: A | Discharge status: Home / Self Care | Payer: BC Managed Care – PPO | Source: Ambulatory Visit | Attending: Orthopedic Surgery | Admitting: Orthopedic Surgery

## 2023-03-03 DIAGNOSIS — M79605 Pain in left leg: Secondary | ICD-10-CM | POA: Diagnosis present
